# Patient Record
Sex: Female | Born: 1990 | Race: White | Hispanic: No | Marital: Married | Smoking: Current every day smoker
Health system: Southern US, Community
[De-identification: ages and names within clinical notes are randomized; demographics above are authoritative.]

## PROBLEM LIST (undated history)

## (undated) DIAGNOSIS — E119 Type 2 diabetes mellitus without complications: Secondary | ICD-10-CM

## (undated) HISTORY — PX: WISDOM TOOTH EXTRACTION: SHX21

---

## 2020-02-28 ENCOUNTER — Other Ambulatory Visit: Payer: Self-pay

## 2020-02-28 ENCOUNTER — Emergency Department: Payer: No Typology Code available for payment source

## 2020-02-28 ENCOUNTER — Emergency Department
Admission: EM | Admit: 2020-02-28 | Discharge: 2020-02-28 | Disposition: A | Discharge status: Home / Self Care | Payer: No Typology Code available for payment source | Attending: Emergency Medicine | Admitting: Emergency Medicine

## 2020-02-28 ENCOUNTER — Encounter: Payer: Self-pay | Admitting: Emergency Medicine

## 2020-02-28 DIAGNOSIS — F17210 Nicotine dependence, cigarettes, uncomplicated: Secondary | ICD-10-CM | POA: Insufficient documentation

## 2020-02-28 DIAGNOSIS — Y9351 Activity, roller skating (inline) and skateboarding: Secondary | ICD-10-CM | POA: Insufficient documentation

## 2020-02-28 DIAGNOSIS — E109 Type 1 diabetes mellitus without complications: Secondary | ICD-10-CM | POA: Insufficient documentation

## 2020-02-28 DIAGNOSIS — Y929 Unspecified place or not applicable: Secondary | ICD-10-CM | POA: Diagnosis not present

## 2020-02-28 DIAGNOSIS — Y999 Unspecified external cause status: Secondary | ICD-10-CM | POA: Insufficient documentation

## 2020-02-28 DIAGNOSIS — S52131A Displaced fracture of neck of right radius, initial encounter for closed fracture: Secondary | ICD-10-CM | POA: Diagnosis not present

## 2020-02-28 DIAGNOSIS — S59901A Unspecified injury of right elbow, initial encounter: Secondary | ICD-10-CM | POA: Diagnosis present

## 2020-02-28 HISTORY — DX: Type 2 diabetes mellitus without complications: E11.9

## 2020-02-28 MED ORDER — ONDANSETRON 4 MG PO TBDP: 4.0000 mg | ORAL_TABLET | Freq: Three times a day (TID) | ORAL | 0 refills | Status: AC | PRN

## 2020-02-28 MED ORDER — ONDANSETRON 4 MG PO TBDP
4.0000 mg | ORAL_TABLET | Freq: Once | ORAL | Status: AC
  Administered 2020-02-28: 4 mg via ORAL
  Filled 2020-02-28: qty 1

## 2020-02-28 MED ORDER — OXYCODONE-ACETAMINOPHEN 5-325 MG PO TABS
1.0000 | ORAL_TABLET | Freq: Once | ORAL | Status: AC
  Administered 2020-02-28: 1 via ORAL
  Filled 2020-02-28: qty 1

## 2020-02-28 MED ORDER — OXYCODONE-ACETAMINOPHEN 5-325 MG PO TABS: 1.0000 | ORAL_TABLET | Freq: Four times a day (QID) | ORAL | 0 refills | Status: AC | PRN

## 2020-02-28 NOTE — ED Triage Notes (Signed)
Patient states that she was roller skating and fell. Patient states that she fell on her right elbow. Patient with complaint of pain to right wrist and elbow.

## 2020-02-28 NOTE — ED Provider Notes (Signed)
Emergency Department Provider Note  ____________________________________________  Time seen: Approximately 11:39 PM  I have reviewed the triage vital signs and the nursing notes.   HISTORY  Chief Complaint Elbow Pain, Wrist Pain, and Fall   Historian Patient     HPI Brandy Campbell is a 29 y.o. female presents to the emergency department with right elbow pain after patient fell while rollerskating.  She denies hitting her head or her neck.  No numbness or tingling in the right hand.  No abrasions or lacerations.  No other alleviating measures have been attempted.   Past Medical History:  Diagnosis Date  . Diabetes mellitus without complication (HCC)    type 1 DM     Immunizations up to date:  Yes.     Past Medical History:  Diagnosis Date  . Diabetes mellitus without complication (HCC)    type 1 DM    There are no problems to display for this patient.   Past Surgical History:  Procedure Laterality Date  . CESAREAN SECTION    . WISDOM TOOTH EXTRACTION      Prior to Admission medications   Medication Sig Start Date End Date Taking? Authorizing Provider  ondansetron (ZOFRAN-ODT) 4 MG disintegrating tablet Take 1 tablet (4 mg total) by mouth every 8 (eight) hours as needed for up to 5 days for nausea or vomiting. 02/28/20 03/04/20  Orvil Feil, PA-C  oxyCODONE-acetaminophen (PERCOCET/ROXICET) 5-325 MG tablet Take 1 tablet by mouth every 6 (six) hours as needed for up to 5 days. 02/28/20 03/04/20  Orvil Feil, PA-C    Allergies Patient has no known allergies.  No family history on file.  Social History Social History   Tobacco Use  . Smoking status: Current Every Day Smoker  . Smokeless tobacco: Never Used  Substance Use Topics  . Alcohol use: Never  . Drug use: Never     Review of Systems  Constitutional: No fever/chills Eyes:  No discharge ENT: No upper respiratory complaints. Respiratory: no cough. No SOB/ use of accessory muscles to  breath Gastrointestinal:   No nausea, no vomiting.  No diarrhea.  No constipation. Musculoskeletal: Patient has right elbow pain.  Skin: Negative for rash, abrasions, lacerations, ecchymosis.    ____________________________________________   PHYSICAL EXAM:  VITAL SIGNS: ED Triage Vitals [02/28/20 2140]  Enc Vitals Group     BP 123/79     Pulse Rate 86     Resp 18     Temp 98.4 F (36.9 C)     Temp Source Oral     SpO2 99 %     Weight 195 lb (88.5 kg)     Height 5\' 6"  (1.676 m)     Head Circumference      Peak Flow      Pain Score 9     Pain Loc      Pain Edu?      Excl. in [place]?      Constitutional: Alert and oriented. Well appearing and in no acute distress. Eyes: Conjunctivae are normal. PERRL. EOMI. Head: Atraumatic. Cardiovascular: Normal rate, regular rhythm. Normal S1 and S2.  Good peripheral circulation. Respiratory: Normal respiratory effort without tachypnea or retractions. Lungs CTAB. Good air entry to the bases with no decreased or absent breath sounds Gastrointestinal: Bowel sounds x 4 quadrants. Soft and nontender to palpation. No guarding or rigidity. No distention. Musculoskeletal: Patient has difficulty performing pronation and supination and right elbow.  Palpable radial pulse, right.  Capillary refill less than  2 seconds on the right. Neurologic:  Normal for age. No gross focal neurologic deficits are appreciated.  Skin:  Skin is warm, dry and intact. No rash noted. Psychiatric: Mood and affect are normal for age. Speech and behavior are normal.   ____________________________________________   LABS (all labs ordered are listed, but only abnormal results are displayed)  Labs Reviewed - No data to display ____________________________________________  EKG   ____________________________________________  RADIOLOGY Unk Pinto, personally viewed and evaluated these images (plain radiographs) as part of my medical decision making, as well as  reviewing the written report by the radiologist.  DG Elbow Complete Right  Result Date: 02/28/2020 CLINICAL DATA:  Right elbow and wrist pain after fall skating. EXAM: RIGHT ELBOW - COMPLETE 3+ VIEW COMPARISON:  None. FINDINGS: Minimally displaced fracture of the radial neck. There is no definite articular extension or evidence of joint effusion. No additional fracture of the elbow. Normal alignment. IMPRESSION: Minimally displaced radial neck fracture. Electronically Signed   By: Keith Rake M.D.   On: 02/28/2020 22:23   DG Wrist Complete Right  Result Date: 02/28/2020 CLINICAL DATA:  Right elbow and wrist pain after fall skating. EXAM: RIGHT WRIST - COMPLETE 3+ VIEW COMPARISON:  None. FINDINGS: There is no evidence of fracture or dislocation. There is no evidence of arthropathy or other focal bone abnormality. Soft tissues are unremarkable. IMPRESSION: Negative radiographs of the right wrist. Electronically Signed   By: Keith Rake M.D.   On: 02/28/2020 22:22    ____________________________________________    PROCEDURES  Procedure(s) performed:     Procedures     Medications  oxyCODONE-acetaminophen (PERCOCET/ROXICET) 5-325 MG per tablet 1 tablet (1 tablet Oral Given 02/28/20 2315)  ondansetron (ZOFRAN-ODT) disintegrating tablet 4 mg (4 mg Oral Given 02/28/20 2316)     ____________________________________________   INITIAL IMPRESSION / ASSESSMENT AND PLAN / ED COURSE  Pertinent labs & imaging results that were available during my care of the patient were reviewed by me and considered in my medical decision making (see chart for details).      Assessment and plan Radial neck fracture 29 year old female presents to the emergency department with right elbow pain after mechanical fall while rollerskating.  X-ray indicates a mildly displaced radial neck fracture.  Patient was placed in a sling and Percocet was given for pain.  She was discharged with a short course of  Percocet.  Return precautions were given to return with new or worsening symptoms.   ____________________________________________  FINAL CLINICAL IMPRESSION(S) / ED DIAGNOSES  Final diagnoses:  Closed displaced fracture of neck of right radius, initial encounter      NEW MEDICATIONS STARTED DURING THIS VISIT:  ED Discharge Orders         Ordered    oxyCODONE-acetaminophen (PERCOCET/ROXICET) 5-325 MG tablet  Every 6 hours PRN     Discontinue  Reprint     02/28/20 2259    ondansetron (ZOFRAN-ODT) 4 MG disintegrating tablet  Every 8 hours PRN     Discontinue  Reprint     02/28/20 2259              This chart was dictated using voice recognition software/Dragon. Despite best efforts to proofread, errors can occur which can change the meaning. Any change was purely unintentional.     Lannie Fields, PA-C 02/28/20 2342    Delman Kitten, MD 02/29/20 [phone]

## 2020-02-28 NOTE — Discharge Instructions (Signed)
Wear sling at home until seen by orthopedics. You have been prescribed Percocet and Zofran for pain.

## 2021-05-06 IMAGING — CR DG ELBOW COMPLETE 3+V*R*
4 series · 4 of 4 positions shown · non-contrast
Comparison: None.

CLINICAL DATA: Right elbow and wrist pain after fall skating.

EXAM:
RIGHT ELBOW - COMPLETE 3+ VIEW

[elbow ap]
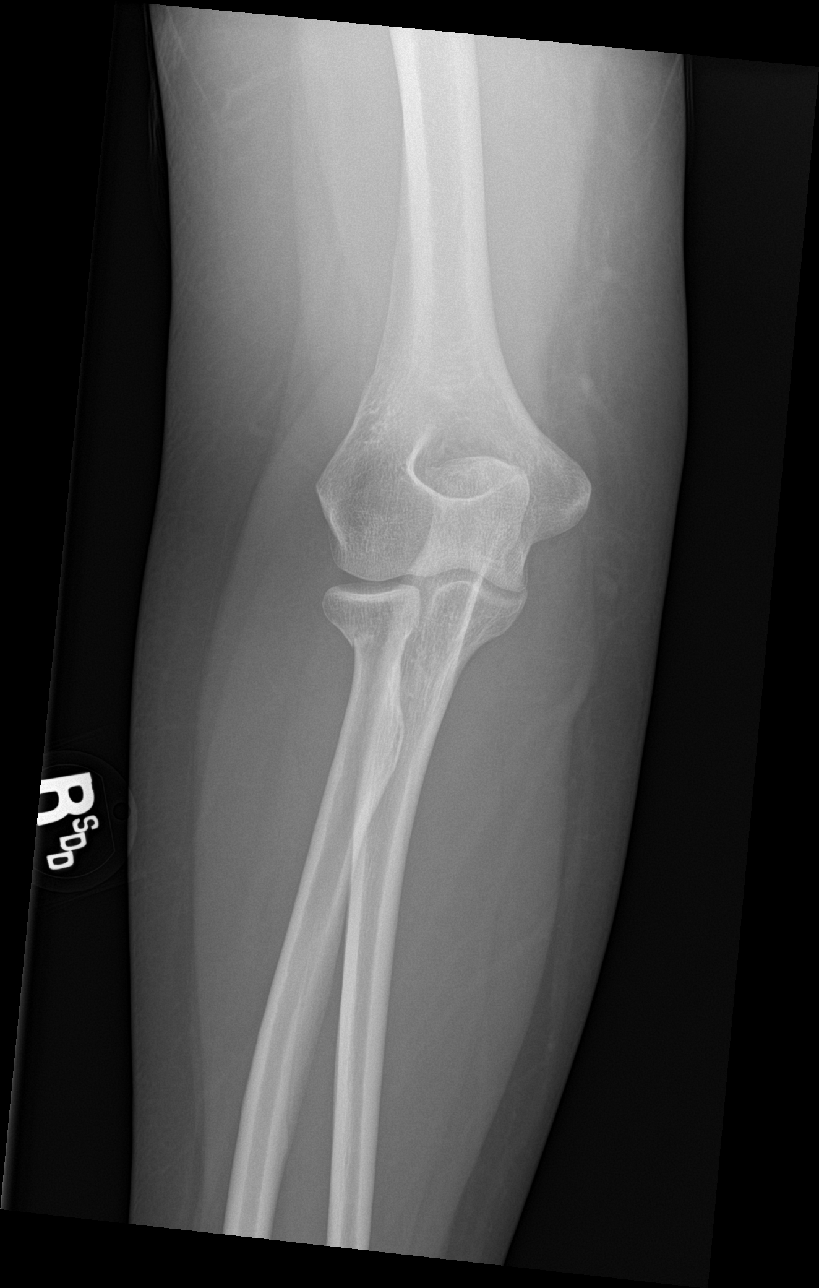

[elbow obl (1 of 2)]
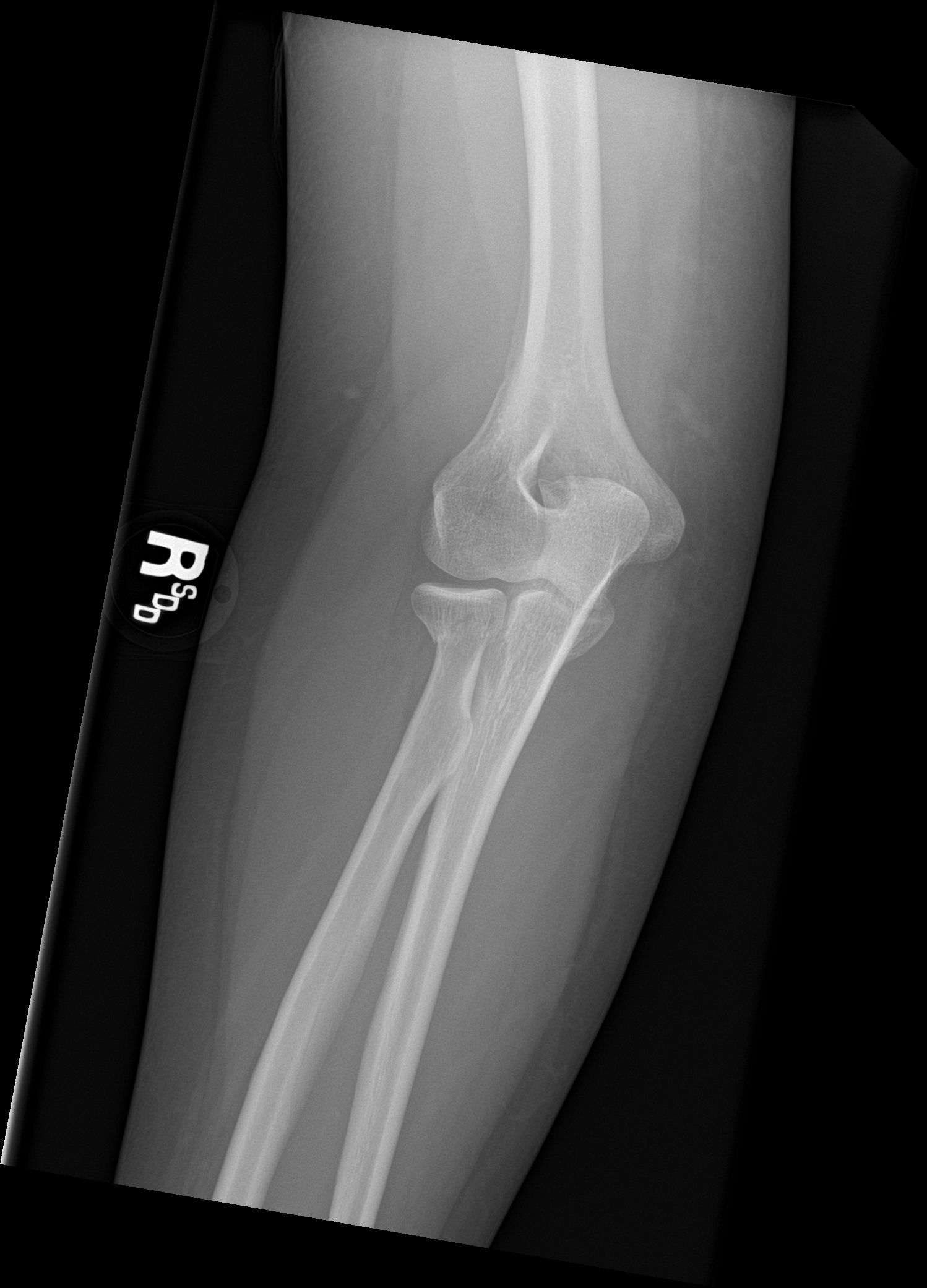

[elbow obl (2 of 2)]
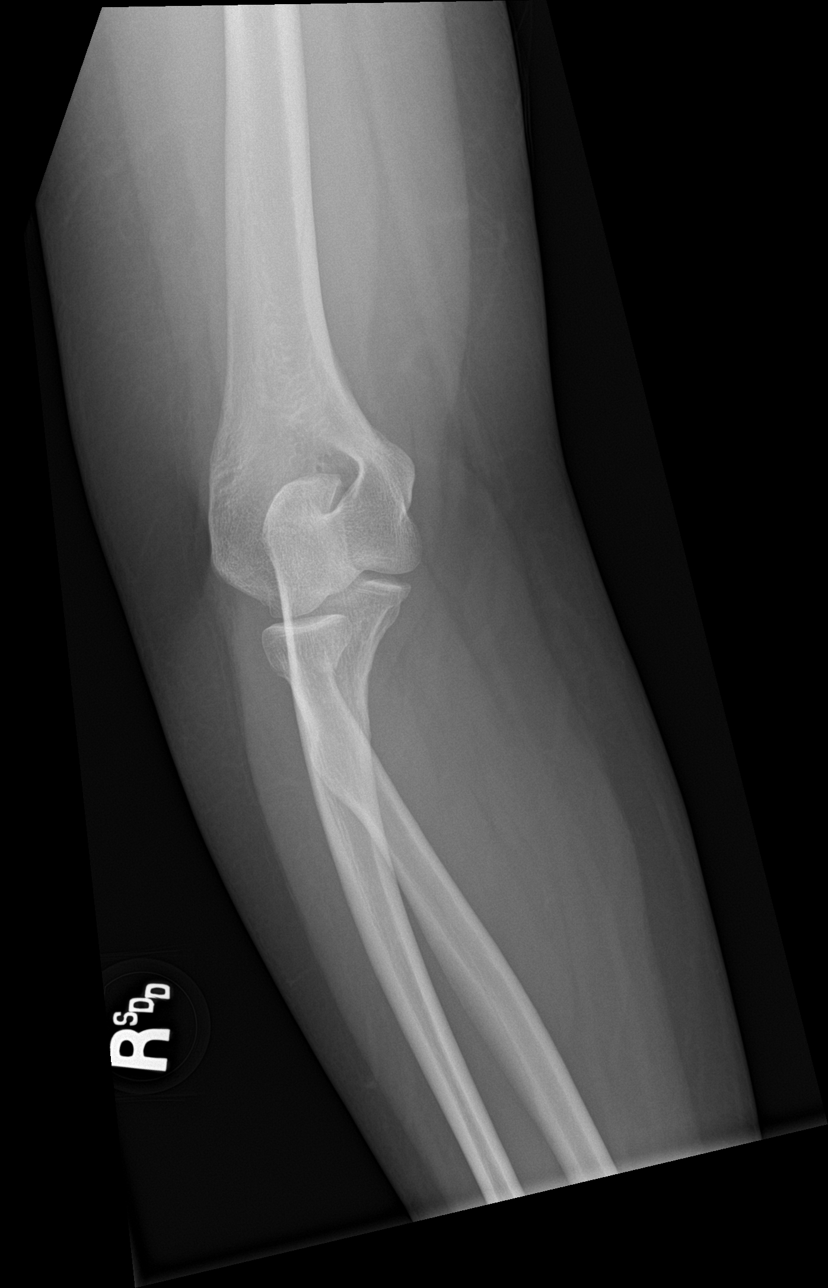

[elbow lat]
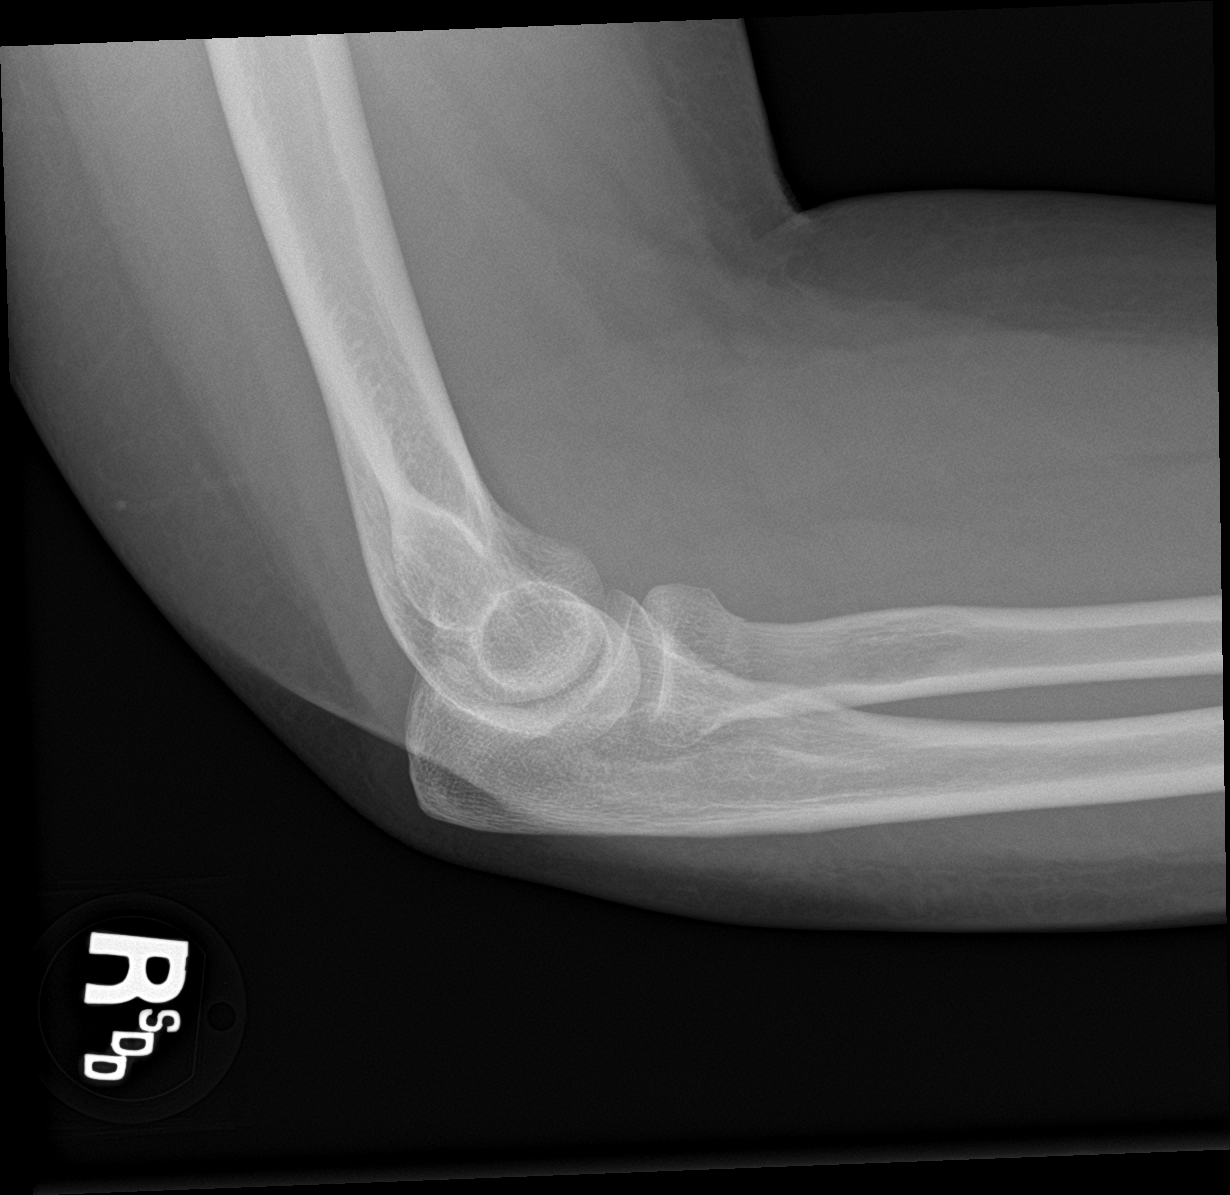

[4 of 4 positions shown; findings below may reference images not displayed]

FINDINGS: Minimally displaced fracture of the radial neck. There is no
definite articular extension or evidence of joint effusion. No
additional fracture of the elbow. Normal alignment.
IMPRESSION: Minimally displaced radial neck fracture.

## 2022-10-12 ENCOUNTER — Emergency Department: Payer: No Typology Code available for payment source

## 2022-10-12 ENCOUNTER — Other Ambulatory Visit: Payer: Self-pay

## 2022-10-12 ENCOUNTER — Emergency Department
Admission: EM | Admit: 2022-10-12 | Discharge: 2022-10-12 | Disposition: A | Discharge status: Home / Self Care | Payer: No Typology Code available for payment source | Attending: Emergency Medicine | Admitting: Emergency Medicine

## 2022-10-12 DIAGNOSIS — Z794 Long term (current) use of insulin: Secondary | ICD-10-CM | POA: Insufficient documentation

## 2022-10-12 DIAGNOSIS — Z3A11 11 weeks gestation of pregnancy: Secondary | ICD-10-CM | POA: Diagnosis not present

## 2022-10-12 DIAGNOSIS — O209 Hemorrhage in early pregnancy, unspecified: Secondary | ICD-10-CM | POA: Insufficient documentation

## 2022-10-12 DIAGNOSIS — E119 Type 2 diabetes mellitus without complications: Secondary | ICD-10-CM | POA: Insufficient documentation

## 2022-10-12 DIAGNOSIS — O4690 Antepartum hemorrhage, unspecified, unspecified trimester: Secondary | ICD-10-CM

## 2022-10-12 LAB — CBC
HCT: 38.3 % (ref 36.0–46.0)
Hemoglobin: 13.3 g/dL (ref 12.0–15.0)
MCH: 30.9 pg (ref 26.0–34.0)
MCHC: 34.7 g/dL (ref 30.0–36.0)
MCV: 88.9 fL (ref 80.0–100.0)
Platelets: 215 10*3/uL (ref 150–400)
RBC: 4.31 MIL/uL (ref 3.87–5.11)
RDW: 12.2 % (ref 11.5–15.5)
WBC: 8.2 10*3/uL (ref 4.0–10.5)
nRBC: 0 % (ref 0.0–0.2)

## 2022-10-12 LAB — BASIC METABOLIC PANEL
Anion gap: 9 (ref 5–15)
BUN: 12 mg/dL (ref 6–20)
CO2: 20 mmol/L — ABNORMAL LOW (ref 22–32)
Calcium: 8.7 mg/dL — ABNORMAL LOW (ref 8.9–10.3)
Chloride: 104 mmol/L (ref 98–111)
Creatinine, Ser: 0.59 mg/dL (ref 0.44–1.00)
GFR, Estimated: >60 mL/min (ref >60)
Glucose, Bld: 159 mg/dL — ABNORMAL HIGH (ref 70–99)
Potassium: 3.6 mmol/L (ref 3.5–5.1)
Sodium: 133 mmol/L — ABNORMAL LOW (ref 135–145)

## 2022-10-12 LAB — URINALYSIS, ROUTINE W REFLEX MICROSCOPIC
Bilirubin Urine: NEGATIVE
Glucose, UA: 50 mg/dL — AB
Ketones, ur: 20 mg/dL — AB
Leukocytes,Ua: NEGATIVE
Nitrite: NEGATIVE
Protein, ur: NEGATIVE mg/dL
Specific Gravity, Urine: 1.021 (ref 1.005–1.030)
pH: 6 (ref 5.0–8.0)

## 2022-10-12 LAB — HCG, QUANTITATIVE, PREGNANCY: hCG, Beta Chain, Quant, S: 208783 m[IU]/mL — ABNORMAL HIGH (ref <5)

## 2022-10-12 LAB — POC URINE PREG, ED: Preg Test, Ur: POSITIVE — AB

## 2022-10-12 LAB — ANTIBODY SCREEN: Antibody Screen: NEGATIVE

## 2022-10-12 LAB — ABO/RH: ABO/RH(D): B NEG

## 2022-10-12 MED ORDER — RHO D IMMUNE GLOBULIN 1500 UNIT/2ML IJ SOSY
300.0000 ug | PREFILLED_SYRINGE | Freq: Once | INTRAMUSCULAR | Status: AC
  Administered 2022-10-12: 300 ug via INTRAMUSCULAR
  Filled 2022-10-12: qty 2

## 2022-10-12 NOTE — ED Triage Notes (Signed)
Pt here with abd cramping and spotting that started this morning. Pt is currently [redacted] weeks pregnant. Pt has nausea but denies V/D.

## 2022-10-12 NOTE — ED Notes (Signed)
See triage note  Presents with some abd cramping and vaginal spotting  States she is about [redacted] weeks pregnant

## 2022-10-12 NOTE — ED Provider Notes (Signed)
[facility] Provider Note    Event Date/Time   First MD Initiated Contact with Patient 10/12/22 0945     (approximate)   History   Abdominal Cramping   HPI  Brandy Campbell is a 32 y.o. female with insulin-dependent diabetes presents emergency department with vaginal bleeding and cramping.  Patient is approximate [redacted] weeks pregnant.  She is G2, P1.  She denies fever or chills.  No chest pain or shortness of breath.  No vomiting at this time although she does just have morning sickness.  They have traveled here from [facility] for a funeral.  Her regular OB/GYN is in [place].  Patient denies any recent sex or trauma.      Physical Exam   Triage Vital Signs: ED Triage Vitals [10/12/22 0930]  Enc Vitals Group     BP 131/78     Pulse Rate 80     Resp 16     Temp 99.1 F (37.3 C)     Temp Source Oral     SpO2 97 %     Weight 195 lb 1.7 oz (88.5 kg)     Height 5\' 6"  (1.676 m)     Head Circumference      Peak Flow      Pain Score 3     Pain Loc      Pain Edu?      Excl. in [place]?     Most recent vital signs: Vitals:   10/12/22 0930 10/12/22 1218  BP: 131/78 128/70  Pulse: 80 78  Resp: 16 16  Temp: 99.1 F (37.3 C)   SpO2: 97% 98%     General: Awake, no distress.   CV:  Good peripheral perfusion. regular rate and  rhythm Resp:  Normal effort. Lungs cta Abd:  No distention.  Nontender Other:      ED Results / Procedures / Treatments   Labs (all labs ordered are listed, but only abnormal results are displayed) Labs Reviewed  BASIC METABOLIC PANEL - Abnormal; Notable for the following components:      Result Value   Sodium 133 (*)    CO2 20 (*)    Glucose, Bld 159 (*)    Calcium 8.7 (*)    All other components within normal limits  HCG, QUANTITATIVE, PREGNANCY - Abnormal; Notable for the following components:   hCG, Beta Neomia Dear 208,783 (*)    All other components within normal limits  URINALYSIS, ROUTINE W REFLEX  MICROSCOPIC - Abnormal; Notable for the following components:   Color, Urine YELLOW (*)    APPearance HAZY (*)    Glucose, UA 50 (*)    Hgb urine dipstick LARGE (*)    Ketones, ur 20 (*)    Bacteria, UA RARE (*)    All other components within normal limits  POC URINE PREG, ED - Abnormal; Notable for the following components:   Preg Test, Ur POSITIVE (*)    All other components within normal limits  CBC  ABO/RH  ANTIBODY SCREEN  RHOGAM INJECTION     EKG     RADIOLOGY Ultrasound OB less than 14 weeks    PROCEDURES:   Procedures   MEDICATIONS ORDERED IN ED: Medications  rho (d) immune globulin (RHIG/RHOPHYLAC) injection 300 mcg (300 mcg Intramuscular Given 10/12/22 1155)     IMPRESSION / MDM / ASSESSMENT AND PLAN / ED COURSE  I reviewed the triage vital signs and the nursing notes.  Differential diagnosis includes, but is not limited to, subchorionic hemorrhage, threatened miscarriage, miscarriage, ectopic  Patient's presentation is most consistent with acute complicated illness / injury requiring diagnostic workup.   The patient's had a previous ultrasound during this pregnancy and was noted to have an IUP per the patient.  Feel that ectopic is less likely.  Patient's POC pregnancy test is positive, ABO Rh is being negative so if she is miscarrying we will need to do RhoGAM   Beta-hCG is reassuring, basic metabolic panel is reassuring, urinalysis does show large amount of hemoglobin, this may be due to her type 1 diabetes  Ultrasound OB less than 14 weeks RhoGAM ordered  Ultrasound OB less than 14 weeks independently reviewed and interpreted by me as being negative for any acute abnormality.  There is a fetus with heartbeat.  Radiologist does not comment on a subchorionic hemorrhage or any reason for the vaginal bleeding.  I did discuss all this with the patient.  Gave her reassurance that the pregnancy is still viable.  She is to  follow-up with her doctor when she returns home.  Return emergency department worsening.  Notify her doctor that we gave her RhoGAM here for the vaginal bleeding in pregnancy.  She is in agreement treatment plan.  Do not feel that she needs to be admitted as she is very stable and is not hemorrhaging.  Discharged in stable condition.   FINAL CLINICAL IMPRESSION(S) / ED DIAGNOSES   Final diagnoses:  Vaginal bleeding in pregnancy     Rx / DC Orders   ED Discharge Orders     None        Note:  This document was prepared using Dragon voice recognition software and may include unintentional dictation errors.    Versie Starks, PA-C 10/12/22 1505    Carrie Mew, MD 10/13/22 [phone]

## 2022-10-13 LAB — RHOGAM INJECTION: Unit division: 0
